# Patient Record
Sex: Female | Born: 1935 | Race: Black or African American | Hispanic: No | Smoking: Never smoker
Health system: Southern US, Community
[De-identification: ages and names within clinical notes are randomized; demographics above are authoritative.]

## PROBLEM LIST (undated history)

## (undated) DIAGNOSIS — M1990 Unspecified osteoarthritis, unspecified site: Secondary | ICD-10-CM

## (undated) DIAGNOSIS — E559 Vitamin D deficiency, unspecified: Secondary | ICD-10-CM

## (undated) DIAGNOSIS — K649 Unspecified hemorrhoids: Secondary | ICD-10-CM

## (undated) DIAGNOSIS — M48061 Spinal stenosis, lumbar region without neurogenic claudication: Secondary | ICD-10-CM

## (undated) DIAGNOSIS — Z9889 Other specified postprocedural states: Secondary | ICD-10-CM

## (undated) DIAGNOSIS — B019 Varicella without complication: Secondary | ICD-10-CM

## (undated) DIAGNOSIS — G629 Polyneuropathy, unspecified: Secondary | ICD-10-CM

## (undated) DIAGNOSIS — H269 Unspecified cataract: Secondary | ICD-10-CM

## (undated) DIAGNOSIS — M810 Age-related osteoporosis without current pathological fracture: Secondary | ICD-10-CM

## (undated) DIAGNOSIS — E119 Type 2 diabetes mellitus without complications: Secondary | ICD-10-CM

## (undated) DIAGNOSIS — N189 Chronic kidney disease, unspecified: Secondary | ICD-10-CM

## (undated) DIAGNOSIS — M109 Gout, unspecified: Secondary | ICD-10-CM

## (undated) DIAGNOSIS — I10 Essential (primary) hypertension: Secondary | ICD-10-CM

## (undated) DIAGNOSIS — K219 Gastro-esophageal reflux disease without esophagitis: Secondary | ICD-10-CM

## (undated) HISTORY — PX: KIDNEY STONE SURGERY: SHX686

## (undated) HISTORY — PX: EYE SURGERY: SHX253

## (undated) HISTORY — PX: RETINAL DETACHMENT SURGERY: SHX105

## (undated) HISTORY — PX: CHOLECYSTECTOMY: SHX55

---

## 1998-11-17 ENCOUNTER — Encounter: Payer: Self-pay | Admitting: Ophthalmology

## 1998-11-17 ENCOUNTER — Ambulatory Visit (HOSPITAL_COMMUNITY): Admission: RE | Admit: 1998-11-17 | Discharge: 1998-11-18 | Payer: Self-pay | Admitting: Ophthalmology

## 2005-01-31 ENCOUNTER — Ambulatory Visit: Payer: Self-pay | Admitting: Internal Medicine

## 2006-03-23 ENCOUNTER — Ambulatory Visit: Payer: Self-pay | Admitting: Internal Medicine

## 2006-09-15 ENCOUNTER — Emergency Department: Payer: Self-pay | Admitting: Emergency Medicine

## 2007-03-29 ENCOUNTER — Ambulatory Visit: Payer: Self-pay | Admitting: Internal Medicine

## 2008-03-31 ENCOUNTER — Ambulatory Visit: Payer: Self-pay | Admitting: Internal Medicine

## 2009-04-02 ENCOUNTER — Ambulatory Visit: Payer: Self-pay | Admitting: Internal Medicine

## 2009-07-14 ENCOUNTER — Ambulatory Visit: Payer: Self-pay | Admitting: Gastroenterology

## 2010-02-01 ENCOUNTER — Ambulatory Visit: Payer: Self-pay | Admitting: Unknown Physician Specialty

## 2011-04-05 ENCOUNTER — Ambulatory Visit: Payer: Self-pay | Admitting: Internal Medicine

## 2011-11-30 ENCOUNTER — Encounter (INDEPENDENT_AMBULATORY_CARE_PROVIDER_SITE_OTHER): Payer: Medicaid Other | Admitting: Ophthalmology

## 2011-11-30 DIAGNOSIS — E11359 Type 2 diabetes mellitus with proliferative diabetic retinopathy without macular edema: Secondary | ICD-10-CM

## 2011-11-30 DIAGNOSIS — H43819 Vitreous degeneration, unspecified eye: Secondary | ICD-10-CM

## 2011-11-30 DIAGNOSIS — E1165 Type 2 diabetes mellitus with hyperglycemia: Secondary | ICD-10-CM

## 2012-01-02 ENCOUNTER — Encounter (INDEPENDENT_AMBULATORY_CARE_PROVIDER_SITE_OTHER): Payer: Medicaid Other | Admitting: Ophthalmology

## 2012-01-02 DIAGNOSIS — H43819 Vitreous degeneration, unspecified eye: Secondary | ICD-10-CM

## 2012-01-02 DIAGNOSIS — E1165 Type 2 diabetes mellitus with hyperglycemia: Secondary | ICD-10-CM

## 2012-01-02 DIAGNOSIS — H26499 Other secondary cataract, unspecified eye: Secondary | ICD-10-CM

## 2012-01-02 DIAGNOSIS — E11359 Type 2 diabetes mellitus with proliferative diabetic retinopathy without macular edema: Secondary | ICD-10-CM

## 2012-01-18 ENCOUNTER — Ambulatory Visit (INDEPENDENT_AMBULATORY_CARE_PROVIDER_SITE_OTHER): Payer: PRIVATE HEALTH INSURANCE | Admitting: Ophthalmology

## 2012-01-20 ENCOUNTER — Ambulatory Visit (INDEPENDENT_AMBULATORY_CARE_PROVIDER_SITE_OTHER): Payer: PRIVATE HEALTH INSURANCE | Admitting: Ophthalmology

## 2012-01-20 DIAGNOSIS — H2700 Aphakia, unspecified eye: Secondary | ICD-10-CM

## 2012-01-20 DIAGNOSIS — E11359 Type 2 diabetes mellitus with proliferative diabetic retinopathy without macular edema: Secondary | ICD-10-CM

## 2012-01-20 DIAGNOSIS — E1139 Type 2 diabetes mellitus with other diabetic ophthalmic complication: Secondary | ICD-10-CM

## 2012-07-23 ENCOUNTER — Ambulatory Visit (INDEPENDENT_AMBULATORY_CARE_PROVIDER_SITE_OTHER): Payer: PRIVATE HEALTH INSURANCE | Admitting: Ophthalmology

## 2012-07-23 DIAGNOSIS — H43819 Vitreous degeneration, unspecified eye: Secondary | ICD-10-CM

## 2012-07-23 DIAGNOSIS — H35039 Hypertensive retinopathy, unspecified eye: Secondary | ICD-10-CM

## 2012-07-23 DIAGNOSIS — E1165 Type 2 diabetes mellitus with hyperglycemia: Secondary | ICD-10-CM

## 2012-07-23 DIAGNOSIS — I10 Essential (primary) hypertension: Secondary | ICD-10-CM

## 2012-07-23 DIAGNOSIS — E11359 Type 2 diabetes mellitus with proliferative diabetic retinopathy without macular edema: Secondary | ICD-10-CM

## 2012-12-26 ENCOUNTER — Emergency Department: Payer: Self-pay | Admitting: Emergency Medicine

## 2013-03-12 ENCOUNTER — Ambulatory Visit: Payer: Self-pay | Admitting: Internal Medicine

## 2013-04-22 ENCOUNTER — Ambulatory Visit (INDEPENDENT_AMBULATORY_CARE_PROVIDER_SITE_OTHER): Payer: PRIVATE HEALTH INSURANCE | Admitting: Ophthalmology

## 2013-04-22 DIAGNOSIS — I10 Essential (primary) hypertension: Secondary | ICD-10-CM

## 2013-04-22 DIAGNOSIS — E1139 Type 2 diabetes mellitus with other diabetic ophthalmic complication: Secondary | ICD-10-CM

## 2013-04-22 DIAGNOSIS — E11359 Type 2 diabetes mellitus with proliferative diabetic retinopathy without macular edema: Secondary | ICD-10-CM

## 2013-04-22 DIAGNOSIS — H43819 Vitreous degeneration, unspecified eye: Secondary | ICD-10-CM

## 2013-04-22 DIAGNOSIS — H47219 Primary optic atrophy, unspecified eye: Secondary | ICD-10-CM

## 2013-04-22 DIAGNOSIS — H35039 Hypertensive retinopathy, unspecified eye: Secondary | ICD-10-CM

## 2014-01-20 ENCOUNTER — Ambulatory Visit (INDEPENDENT_AMBULATORY_CARE_PROVIDER_SITE_OTHER): Payer: PRIVATE HEALTH INSURANCE | Admitting: Ophthalmology

## 2014-01-20 DIAGNOSIS — E1139 Type 2 diabetes mellitus with other diabetic ophthalmic complication: Secondary | ICD-10-CM

## 2014-01-20 DIAGNOSIS — I10 Essential (primary) hypertension: Secondary | ICD-10-CM

## 2014-01-20 DIAGNOSIS — H43819 Vitreous degeneration, unspecified eye: Secondary | ICD-10-CM

## 2014-01-20 DIAGNOSIS — H35039 Hypertensive retinopathy, unspecified eye: Secondary | ICD-10-CM

## 2014-01-20 DIAGNOSIS — H47219 Primary optic atrophy, unspecified eye: Secondary | ICD-10-CM

## 2014-01-20 DIAGNOSIS — E1165 Type 2 diabetes mellitus with hyperglycemia: Secondary | ICD-10-CM

## 2014-01-20 DIAGNOSIS — E11359 Type 2 diabetes mellitus with proliferative diabetic retinopathy without macular edema: Secondary | ICD-10-CM

## 2014-10-22 ENCOUNTER — Ambulatory Visit (INDEPENDENT_AMBULATORY_CARE_PROVIDER_SITE_OTHER): Payer: Medicare Other | Admitting: Ophthalmology

## 2014-10-22 DIAGNOSIS — E11311 Type 2 diabetes mellitus with unspecified diabetic retinopathy with macular edema: Secondary | ICD-10-CM

## 2014-10-22 DIAGNOSIS — H35033 Hypertensive retinopathy, bilateral: Secondary | ICD-10-CM | POA: Diagnosis not present

## 2014-10-22 DIAGNOSIS — H43813 Vitreous degeneration, bilateral: Secondary | ICD-10-CM

## 2014-10-22 DIAGNOSIS — I10 Essential (primary) hypertension: Secondary | ICD-10-CM

## 2014-10-22 DIAGNOSIS — E11351 Type 2 diabetes mellitus with proliferative diabetic retinopathy with macular edema: Secondary | ICD-10-CM | POA: Diagnosis not present

## 2014-10-22 DIAGNOSIS — E11359 Type 2 diabetes mellitus with proliferative diabetic retinopathy without macular edema: Secondary | ICD-10-CM

## 2014-11-06 ENCOUNTER — Ambulatory Visit
Admit: 2014-11-06 | Disposition: A | Discharge status: Home / Self Care | Payer: Self-pay | Attending: Gastroenterology | Admitting: Gastroenterology

## 2015-03-23 ENCOUNTER — Encounter: Payer: Self-pay | Admitting: *Deleted

## 2015-03-24 ENCOUNTER — Ambulatory Visit
Admission: RE | Admit: 2015-03-24 | Discharge: 2015-03-24 | Disposition: A | Discharge status: Home / Self Care | Payer: Medicare Other | Source: Ambulatory Visit | Attending: Gastroenterology | Admitting: Gastroenterology

## 2015-03-24 ENCOUNTER — Ambulatory Visit: Payer: Medicare Other | Admitting: Anesthesiology

## 2015-03-24 ENCOUNTER — Encounter
Admission: RE | Disposition: A | Discharge status: Home / Self Care | Payer: Self-pay | Source: Ambulatory Visit | Attending: Gastroenterology

## 2015-03-24 DIAGNOSIS — M810 Age-related osteoporosis without current pathological fracture: Secondary | ICD-10-CM | POA: Diagnosis not present

## 2015-03-24 DIAGNOSIS — K2950 Unspecified chronic gastritis without bleeding: Secondary | ICD-10-CM | POA: Diagnosis not present

## 2015-03-24 DIAGNOSIS — B3781 Candidal esophagitis: Secondary | ICD-10-CM | POA: Diagnosis not present

## 2015-03-24 DIAGNOSIS — R1310 Dysphagia, unspecified: Secondary | ICD-10-CM | POA: Diagnosis present

## 2015-03-24 DIAGNOSIS — E559 Vitamin D deficiency, unspecified: Secondary | ICD-10-CM | POA: Insufficient documentation

## 2015-03-24 DIAGNOSIS — Z7982 Long term (current) use of aspirin: Secondary | ICD-10-CM | POA: Insufficient documentation

## 2015-03-24 DIAGNOSIS — M109 Gout, unspecified: Secondary | ICD-10-CM | POA: Insufficient documentation

## 2015-03-24 DIAGNOSIS — Z79899 Other long term (current) drug therapy: Secondary | ICD-10-CM | POA: Insufficient documentation

## 2015-03-24 DIAGNOSIS — I10 Essential (primary) hypertension: Secondary | ICD-10-CM | POA: Insufficient documentation

## 2015-03-24 DIAGNOSIS — M1990 Unspecified osteoarthritis, unspecified site: Secondary | ICD-10-CM | POA: Diagnosis not present

## 2015-03-24 DIAGNOSIS — K219 Gastro-esophageal reflux disease without esophagitis: Secondary | ICD-10-CM | POA: Diagnosis not present

## 2015-03-24 DIAGNOSIS — E1140 Type 2 diabetes mellitus with diabetic neuropathy, unspecified: Secondary | ICD-10-CM | POA: Insufficient documentation

## 2015-03-24 DIAGNOSIS — K449 Diaphragmatic hernia without obstruction or gangrene: Secondary | ICD-10-CM | POA: Diagnosis not present

## 2015-03-24 HISTORY — PX: ESOPHAGOGASTRODUODENOSCOPY (EGD) WITH PROPOFOL: SHX5813

## 2015-03-24 HISTORY — DX: Spinal stenosis, lumbar region without neurogenic claudication: M48.061

## 2015-03-24 HISTORY — DX: Hypomagnesemia: E83.42

## 2015-03-24 HISTORY — DX: Unspecified osteoarthritis, unspecified site: M19.90

## 2015-03-24 HISTORY — DX: Unspecified cataract: H26.9

## 2015-03-24 HISTORY — DX: Other specified postprocedural states: Z98.890

## 2015-03-24 HISTORY — DX: Type 2 diabetes mellitus without complications: E11.9

## 2015-03-24 HISTORY — DX: Unspecified hemorrhoids: K64.9

## 2015-03-24 HISTORY — DX: Chronic kidney disease, unspecified: N18.9

## 2015-03-24 HISTORY — DX: Varicella without complication: B01.9

## 2015-03-24 HISTORY — DX: Essential (primary) hypertension: I10

## 2015-03-24 HISTORY — DX: Polyneuropathy, unspecified: G62.9

## 2015-03-24 HISTORY — DX: Gastro-esophageal reflux disease without esophagitis: K21.9

## 2015-03-24 HISTORY — DX: Age-related osteoporosis without current pathological fracture: M81.0

## 2015-03-24 HISTORY — DX: Vitamin D deficiency, unspecified: E55.9

## 2015-03-24 HISTORY — DX: Gout, unspecified: M10.9

## 2015-03-24 LAB — GLUCOSE, CAPILLARY: Glucose-Capillary: 118 mg/dL — ABNORMAL HIGH (ref 65–99)

## 2015-03-24 SURGERY — ESOPHAGOGASTRODUODENOSCOPY (EGD) WITH PROPOFOL
Anesthesia: General

## 2015-03-24 MED ORDER — SODIUM CHLORIDE 0.9 % IV SOLN
INTRAVENOUS | Status: DC
  Administered 2015-03-24: 1000 mL via INTRAVENOUS
  Administered 2015-03-24: 09:00:00 via INTRAVENOUS

## 2015-03-24 MED ORDER — FENTANYL CITRATE (PF) 100 MCG/2ML IJ SOLN
INTRAMUSCULAR | Status: DC | PRN
  Administered 2015-03-24: 50 ug via INTRAVENOUS

## 2015-03-24 MED ORDER — MIDAZOLAM HCL 5 MG/5ML IJ SOLN
INTRAMUSCULAR | Status: DC | PRN
  Administered 2015-03-24: 1 mg via INTRAVENOUS

## 2015-03-24 MED ORDER — PROPOFOL 10 MG/ML IV BOLUS
INTRAVENOUS | Status: DC | PRN
  Administered 2015-03-24: 50 mg via INTRAVENOUS
  Administered 2015-03-24: 25 mg via INTRAVENOUS
  Administered 2015-03-24: 50 mg via INTRAVENOUS

## 2015-03-24 NOTE — Transfer of Care (Signed)
Immediate Anesthesia Transfer of Care Note  Patient: Megan Griffin  Procedure(s) Performed: Procedure(s): ESOPHAGOGASTRODUODENOSCOPY (EGD) WITH PROPOFOL (N/A)  Patient Location: PACU  Anesthesia Type:General  Level of Consciousness: sedated  Airway & Oxygen Therapy: Patient Spontanous Breathing  Post-op Assessment: Report given to RN  Post vital signs: stable  Last Vitals:  Filed Vitals:   03/24/15 0825  BP: 166/86  Pulse: 90  Temp: 36.4 C  Resp: 18    Complications: No apparent anesthesia complications

## 2015-03-24 NOTE — Op Note (Signed)
[facility] Patient Name: Megan Griffin Procedure Date: 03/24/2015 8:53 AM MRN: [id number] Account #: [id number] Date of Birth: Jan 08, 1931 Admit Type: Outpatient Age: 81 Room: [facility] ENDO ROOM 3 Gender: Female Note Status: Finalized Procedure:         Upper GI endoscopy Indications:       Dysphagia Providers:         Christena Deem, MD Referring MD:      Neomia Dear. Harrington Challenger, MD (Referring MD) Medicines:         Monitored Anesthesia Care Complications:     No immediate complications. Procedure:         Pre-Anesthesia Assessment:                    - ASA Grade Assessment: III - A patient with severe                     systemic disease.                    After obtaining informed consent, the endoscope was passed                     under direct vision. Throughout the procedure, the                     patient's blood pressure, pulse, and oxygen saturations                     were monitored continuously. The Endoscope was introduced                     through the mouth, and advanced to the third part of                     duodenum. The patient tolerated the procedure well. The                     upper GI endoscopy was accomplished without difficulty. Findings:      The middle third of the esophagus and lower third of the esophagus were       significantly tortuous.      A medium-sized hiatus hernia was found. The Z-line was a variable       distance from incisors; the hiatal hernia was sliding.      Patchy candidiasis was found in the upper third of the esophagus, in the       middle third of the esophagus and in the lower third of the esophagus.      The Z-line was regular. Biopsies were taken with a cold forceps for       histology.      Patchy mild inflammation characterized by congestion (edema), erosions       and erythema was found in the gastric antrum. Biopsies were taken with a       cold forceps for histology. Biopsies were taken with  a cold forceps for       Helicobacter pylori testing.      The cardia and gastric fundus were normal on retroflexion pootherwise,       note moderate sliding hiatal hernia.      The examined duodenum was normal. Impression:        - Tortuous esophagus.                    -  Medium-sized hiatus hernia.                    - Monilial esophagitis.                    - Z-line regular. Biopsied.                    - Erosive gastritis. Biopsied.                    - Normal examined duodenum. Recommendation:    - Discharge patient to home.                    - Continue present medications. Procedure Code(s): --- Professional ---                    [phone], Esophagogastroduodenoscopy, flexible, transoral;                     with biopsy, single or multiple Diagnosis Code(s): --- Professional ---                    750.4, Other specified anomalies of esophagus                    112.84, Candidal esophagitis                    535.40, Other specified gastritis, without mention of                     hemorrhage                    787.20, Dysphagia, unspecified                    553.3, Diaphragmatic hernia without mention of obstruction                     or gangrene CPT copyright 2014 American Medical Association. All rights reserved. The codes documented in this report are preliminary and upon coder review may  be revised to meet current compliance requirements. Christena Deem, MD 03/24/2015 9:16:43 AM This report has been signed electronically. Number of Addenda: 0 Note Initiated On: 03/24/2015 8:53 AM      [facility]

## 2015-03-24 NOTE — Anesthesia Preprocedure Evaluation (Addendum)
Anesthesia Evaluation  Patient identified by MRN, date of birth, ID band Patient awake    Reviewed: Allergy & Precautions, NPO status , Patient's Chart, lab work & pertinent test results  History of Anesthesia Complications Negative for: history of anesthetic complications  Airway Mallampati: II  TM Distance: >3 FB Neck ROM: Full    Dental  (+) Upper Dentures, Chipped, Missing   Pulmonary neg pulmonary ROS,           Cardiovascular hypertension, Pt. on medications      Neuro/Psych  Neuromuscular disease (pheripheralneuropathy)    GI/Hepatic Neg liver ROS, GERD  Medicated,  Endo/Other  diabetes, Type 2, Oral Hypoglycemic Agents  Renal/GU Renal InsufficiencyRenal disease     Musculoskeletal  (+) Arthritis  (Gout),   Abdominal   Peds  Hematology negative hematology ROS (+)   Anesthesia Other Findings   Reproductive/Obstetrics                            Anesthesia Physical Anesthesia Plan  ASA: III  Anesthesia Plan: General   Post-op Pain Management:    Induction: Intravenous  Airway Management Planned: Nasal Cannula  Additional Equipment:   Intra-op Plan:   Post-operative Plan:   Informed Consent: I have reviewed the patients History and Physical, chart, labs and discussed the procedure including the risks, benefits and alternatives for the proposed anesthesia with the patient or authorized representative who has indicated his/her understanding and acceptance.     Plan Discussed with:   Anesthesia Plan Comments:         Anesthesia Quick Evaluation

## 2015-03-24 NOTE — H&P (Addendum)
Outpatient short stay form Pre-procedure 03/24/2015 8:50 AM Christena Deem MD  Primary Physician: Dr. Hal Morales  Reason for visit:  Dysphagia, abnormal barium swallow  History of present illness:  Patient is a 81 year old female presenting today with complaint of his age it. She was initially seen in April 2016. There is a barium swallow done showing marked distal esophageal tortuosity a medium-sized hiatal hernia also reflux. Report stated there was some narrowing of the distal esophagus however she passed the barium tablet without difficulty. Since then she has been in careful with eating and this has nearly resolved her issues. It is of note that she has no upper teeth and molars on the right side lower.    Current facility-administered medications:  .  0.9 %  sodium chloride infusion, , Intravenous, Continuous, Christena Deem, MD, Last Rate: 20 mL/hr at 03/24/15 0841, 1,000 mL at 03/24/15 0841  Prescriptions prior to admission  Medication Sig Dispense Refill Last Dose  . acetaminophen (TYLENOL) 500 MG tablet Take 500 mg by mouth every 6 (six) hours as needed.   03/24/2015 at Unknown time  . allopurinol (ZYLOPRIM) 100 MG tablet Take 200 mg by mouth daily.   03/24/2015 at Unknown time  . amLODipine (NORVASC) 5 MG tablet Take 5 mg by mouth daily.   03/24/2015 at Unknown time  . aspirin 81 MG tablet Take 81 mg by mouth daily.   03/23/2015 at Unknown time  . calcium-vitamin D (OSCAL WITH D) 500-200 MG-UNIT per tablet Take 1 tablet by mouth.   Past Week at Unknown time  . Cyanocobalamin (VITAMIN B 12) 250 MCG LOZG Take by mouth.     . Fish Oil-Cholecalciferol (FISH OIL + D3) 1000-1000 MG-UNIT CAPS Take 1,000 mg by mouth every morning.   Past Week at Unknown time  . furosemide (LASIX) 40 MG tablet Take 40 mg by mouth daily.   Past Week at Unknown time  . glipiZIDE (GLUCOTROL XL) 2.5 MG 24 hr tablet Take 2.5 mg by mouth daily with breakfast.     . glucose blood test strip 1 each by Other  route as needed for other. Use as instructed   03/23/2015 at Unknown time  . lisinopril (PRINIVIL,ZESTRIL) 40 MG tablet Take 40 mg by mouth daily.   03/23/2015 at Unknown time  . magnesium oxide (MAG-OX) 400 MG tablet Take 400 mg by mouth daily.   03/24/2015 at Unknown time  . Multiple Vitamin (MULTIVITAMIN) tablet Take 1 tablet by mouth daily.   03/24/2015 at Unknown time  . omeprazole (PRILOSEC) 20 MG capsule Take 20 mg by mouth daily.     . vitamin C (ASCORBIC ACID) 500 MG tablet Take 500 mg by mouth daily.   Past Week at Unknown time     Allergies  Allergen Reactions  . Actonel [Risedronate Sodium]   . Bextra [Valdecoxib]   . Lipitor [Atorvastatin]   . Lovastatin   . Sular [Nisoldipine Er]   . Zocor [Simvastatin]      Past Medical History  Diagnosis Date  . Hypertension   . Diabetes mellitus without complication   . Gout   . Arthritis   . Osteoporosis   . Lumbar spinal stenosis   . GERD (gastroesophageal reflux disease)   . Vitamin D deficiency   . Hypomagnesemia   . Chronic kidney disease   . Peripheral neuropathy   . Chickenpox   . Hemorrhoids   . Cataracts, bilateral   . H/O colonoscopy     Review of  systems:      Physical Exam    Heart and lungs: Regular rate and rhythm without rub or gallop, lungs are bilaterally clear    HEENT: Norm cephalic atraumatic eyes are anicteric    Other:     Pertinant exam for procedure: Soft nontender nondistended bowel sounds positive normoactive    Planned proceedures: EGD and indicated procedures. I have discussed the risks benefits and complications of procedures to include not limited to bleeding, infection, perforation and the risk of sedation and the patient wishes to proceed.    Christena Deem, MD Gastroenterology 03/24/2015  8:50 AM

## 2015-03-24 NOTE — Anesthesia Postprocedure Evaluation (Signed)
  Anesthesia Post-op Note  Patient: Megan Griffin  Procedure(s) Performed: Procedure(s): ESOPHAGOGASTRODUODENOSCOPY (EGD) WITH PROPOFOL (N/A)  Anesthesia type:General  Patient location: PACU  Post pain: Pain level controlled  Post assessment: Post-op Vital signs reviewed, Patient's Cardiovascular Status Stable, Respiratory Function Stable, Patent Airway and No signs of Nausea or vomiting  Post vital signs: Reviewed and stable  Last Vitals:  Filed Vitals:   03/24/15 0913  BP: 109/70  Pulse: 80  Temp: 36.1 C  Resp: 16    Level of consciousness: awake, alert  and patient cooperative  Complications: No apparent anesthesia complications

## 2015-03-25 ENCOUNTER — Encounter: Payer: Self-pay | Admitting: Gastroenterology

## 2015-03-25 LAB — SURGICAL PATHOLOGY

## 2015-07-24 ENCOUNTER — Ambulatory Visit (INDEPENDENT_AMBULATORY_CARE_PROVIDER_SITE_OTHER): Payer: Medicare Other | Admitting: Ophthalmology

## 2015-08-17 ENCOUNTER — Ambulatory Visit (INDEPENDENT_AMBULATORY_CARE_PROVIDER_SITE_OTHER): Payer: Medicare Other | Admitting: Ophthalmology

## 2015-08-17 DIAGNOSIS — I10 Essential (primary) hypertension: Secondary | ICD-10-CM | POA: Diagnosis not present

## 2015-08-17 DIAGNOSIS — E11319 Type 2 diabetes mellitus with unspecified diabetic retinopathy without macular edema: Secondary | ICD-10-CM

## 2015-08-17 DIAGNOSIS — H43813 Vitreous degeneration, bilateral: Secondary | ICD-10-CM

## 2015-08-17 DIAGNOSIS — H35033 Hypertensive retinopathy, bilateral: Secondary | ICD-10-CM | POA: Diagnosis not present

## 2015-08-17 DIAGNOSIS — E113593 Type 2 diabetes mellitus with proliferative diabetic retinopathy without macular edema, bilateral: Secondary | ICD-10-CM

## 2015-09-22 ENCOUNTER — Other Ambulatory Visit: Payer: Self-pay | Admitting: Internal Medicine

## 2015-09-22 DIAGNOSIS — Z1231 Encounter for screening mammogram for malignant neoplasm of breast: Secondary | ICD-10-CM

## 2015-10-15 ENCOUNTER — Ambulatory Visit
Admission: RE | Admit: 2015-10-15 | Discharge: 2015-10-15 | Disposition: A | Discharge status: Home / Self Care | Payer: Medicare Other | Source: Ambulatory Visit | Attending: Internal Medicine | Admitting: Internal Medicine

## 2015-10-15 DIAGNOSIS — Z1231 Encounter for screening mammogram for malignant neoplasm of breast: Secondary | ICD-10-CM | POA: Diagnosis not present

## 2016-05-16 ENCOUNTER — Ambulatory Visit (INDEPENDENT_AMBULATORY_CARE_PROVIDER_SITE_OTHER): Payer: Medicare Other | Admitting: Ophthalmology

## 2016-05-16 DIAGNOSIS — E113593 Type 2 diabetes mellitus with proliferative diabetic retinopathy without macular edema, bilateral: Secondary | ICD-10-CM

## 2016-05-16 DIAGNOSIS — E11319 Type 2 diabetes mellitus with unspecified diabetic retinopathy without macular edema: Secondary | ICD-10-CM

## 2016-05-16 DIAGNOSIS — I10 Essential (primary) hypertension: Secondary | ICD-10-CM | POA: Diagnosis not present

## 2016-05-16 DIAGNOSIS — H43813 Vitreous degeneration, bilateral: Secondary | ICD-10-CM | POA: Diagnosis not present

## 2016-05-16 DIAGNOSIS — H35033 Hypertensive retinopathy, bilateral: Secondary | ICD-10-CM

## 2016-11-21 ENCOUNTER — Other Ambulatory Visit: Payer: Self-pay | Admitting: Internal Medicine

## 2016-11-21 DIAGNOSIS — Z1231 Encounter for screening mammogram for malignant neoplasm of breast: Secondary | ICD-10-CM

## 2016-12-01 ENCOUNTER — Ambulatory Visit
Admission: RE | Admit: 2016-12-01 | Discharge: 2016-12-01 | Disposition: A | Discharge status: Home / Self Care | Payer: Medicare Other | Source: Ambulatory Visit | Attending: Internal Medicine | Admitting: Internal Medicine

## 2016-12-01 DIAGNOSIS — Z1231 Encounter for screening mammogram for malignant neoplasm of breast: Secondary | ICD-10-CM | POA: Insufficient documentation

## 2017-02-13 ENCOUNTER — Ambulatory Visit (INDEPENDENT_AMBULATORY_CARE_PROVIDER_SITE_OTHER): Payer: Medicare Other | Admitting: Ophthalmology

## 2017-02-13 DIAGNOSIS — H35033 Hypertensive retinopathy, bilateral: Secondary | ICD-10-CM

## 2017-02-13 DIAGNOSIS — I10 Essential (primary) hypertension: Secondary | ICD-10-CM | POA: Diagnosis not present

## 2017-02-13 DIAGNOSIS — E113593 Type 2 diabetes mellitus with proliferative diabetic retinopathy without macular edema, bilateral: Secondary | ICD-10-CM

## 2017-02-13 DIAGNOSIS — E11319 Type 2 diabetes mellitus with unspecified diabetic retinopathy without macular edema: Secondary | ICD-10-CM

## 2017-11-14 ENCOUNTER — Encounter (INDEPENDENT_AMBULATORY_CARE_PROVIDER_SITE_OTHER): Payer: Medicare Other | Admitting: Ophthalmology

## 2017-11-23 ENCOUNTER — Encounter (INDEPENDENT_AMBULATORY_CARE_PROVIDER_SITE_OTHER): Payer: Medicare Other | Admitting: Ophthalmology

## 2017-11-23 DIAGNOSIS — E11319 Type 2 diabetes mellitus with unspecified diabetic retinopathy without macular edema: Secondary | ICD-10-CM

## 2017-11-23 DIAGNOSIS — E113593 Type 2 diabetes mellitus with proliferative diabetic retinopathy without macular edema, bilateral: Secondary | ICD-10-CM

## 2017-11-23 DIAGNOSIS — I10 Essential (primary) hypertension: Secondary | ICD-10-CM

## 2017-11-23 DIAGNOSIS — H43813 Vitreous degeneration, bilateral: Secondary | ICD-10-CM | POA: Diagnosis not present

## 2017-11-23 DIAGNOSIS — H35033 Hypertensive retinopathy, bilateral: Secondary | ICD-10-CM

## 2018-09-10 ENCOUNTER — Encounter (INDEPENDENT_AMBULATORY_CARE_PROVIDER_SITE_OTHER): Payer: Medicare Other | Admitting: Ophthalmology

## 2018-09-10 DIAGNOSIS — I10 Essential (primary) hypertension: Secondary | ICD-10-CM | POA: Diagnosis not present

## 2018-09-10 DIAGNOSIS — E113593 Type 2 diabetes mellitus with proliferative diabetic retinopathy without macular edema, bilateral: Secondary | ICD-10-CM | POA: Diagnosis not present

## 2018-09-10 DIAGNOSIS — E11319 Type 2 diabetes mellitus with unspecified diabetic retinopathy without macular edema: Secondary | ICD-10-CM

## 2018-09-10 DIAGNOSIS — H35033 Hypertensive retinopathy, bilateral: Secondary | ICD-10-CM | POA: Diagnosis not present

## 2018-09-10 DIAGNOSIS — H43813 Vitreous degeneration, bilateral: Secondary | ICD-10-CM

## 2019-04-01 ENCOUNTER — Other Ambulatory Visit: Payer: Self-pay | Admitting: Internal Medicine

## 2019-04-01 DIAGNOSIS — Z1231 Encounter for screening mammogram for malignant neoplasm of breast: Secondary | ICD-10-CM

## 2019-04-16 ENCOUNTER — Ambulatory Visit
Admission: RE | Admit: 2019-04-16 | Discharge: 2019-04-16 | Disposition: A | Discharge status: Home / Self Care | Payer: Medicare Other | Source: Ambulatory Visit | Attending: Internal Medicine | Admitting: Internal Medicine

## 2019-04-16 ENCOUNTER — Other Ambulatory Visit: Payer: Self-pay

## 2019-04-16 DIAGNOSIS — Z1231 Encounter for screening mammogram for malignant neoplasm of breast: Secondary | ICD-10-CM | POA: Insufficient documentation

## 2019-06-12 ENCOUNTER — Encounter (INDEPENDENT_AMBULATORY_CARE_PROVIDER_SITE_OTHER): Payer: Medicare Other | Admitting: Ophthalmology

## 2019-07-03 ENCOUNTER — Encounter (INDEPENDENT_AMBULATORY_CARE_PROVIDER_SITE_OTHER): Payer: Medicare Other | Admitting: Ophthalmology

## 2019-07-03 DIAGNOSIS — H43813 Vitreous degeneration, bilateral: Secondary | ICD-10-CM

## 2019-07-03 DIAGNOSIS — E113593 Type 2 diabetes mellitus with proliferative diabetic retinopathy without macular edema, bilateral: Secondary | ICD-10-CM | POA: Diagnosis not present

## 2019-07-03 DIAGNOSIS — H35033 Hypertensive retinopathy, bilateral: Secondary | ICD-10-CM | POA: Diagnosis not present

## 2019-07-03 DIAGNOSIS — I10 Essential (primary) hypertension: Secondary | ICD-10-CM | POA: Diagnosis not present

## 2019-07-03 DIAGNOSIS — E11319 Type 2 diabetes mellitus with unspecified diabetic retinopathy without macular edema: Secondary | ICD-10-CM | POA: Diagnosis not present

## 2019-09-13 ENCOUNTER — Ambulatory Visit: Payer: Medicare Other | Attending: Internal Medicine

## 2019-09-13 DIAGNOSIS — Z23 Encounter for immunization: Secondary | ICD-10-CM | POA: Insufficient documentation

## 2019-09-13 NOTE — Progress Notes (Signed)
   Covid-19 Vaccination Clinic  Name:  JUANICE WARBURTON    MRN: [id number] DOB: 01-12-1931  09/13/2019  Ms. Draughon was observed post Covid-19 immunization for 15 minutes without incident. She was provided with Vaccine Information Sheet and instruction to access the V-Safe system.   Ms. Stanek was instructed to call 911 with any severe reactions post vaccine: Marland Kitchen Difficulty breathing  . Swelling of face and throat  . A fast heartbeat  . A bad rash all over body  . Dizziness and weakness   Immunizations Administered    Name Date Dose VIS Date Route   Pfizer COVID-19 Vaccine 09/13/2019 10:56 AM 0.3 mL 06/21/2019 Intramuscular   Manufacturer: [employer], [employer]   Lot: [id number]   NDC: [address]-1

## 2019-10-04 ENCOUNTER — Ambulatory Visit: Payer: Medicare Other | Attending: Internal Medicine

## 2019-10-04 DIAGNOSIS — Z23 Encounter for immunization: Secondary | ICD-10-CM

## 2019-10-04 NOTE — Progress Notes (Signed)
   Covid-19 Vaccination Clinic  Name:  Megan Griffin    MRN: [id number] DOB: November 03, 1930  10/04/2019  Megan Griffin was observed post Covid-19 immunization for 15 minutes without incident. She was provided with Vaccine Information Sheet and instruction to access the V-Safe system.   Megan Griffin was instructed to call 911 with any severe reactions post vaccine: Marland Kitchen Difficulty breathing  . Swelling of face and throat  . A fast heartbeat  . A bad rash all over body  . Dizziness and weakness   Immunizations Administered    Name Date Dose VIS Date Route   Pfizer COVID-19 Vaccine 10/04/2019 11:09 AM 0.3 mL 06/21/2019 Intramuscular   Manufacturer: [employer], [employer]   Lot: [id number]   NDC: [address]-2

## 2020-03-31 ENCOUNTER — Encounter (INDEPENDENT_AMBULATORY_CARE_PROVIDER_SITE_OTHER): Payer: Medicare Other | Admitting: Ophthalmology

## 2020-04-02 ENCOUNTER — Encounter (INDEPENDENT_AMBULATORY_CARE_PROVIDER_SITE_OTHER): Payer: Medicare Other | Admitting: Ophthalmology

## 2020-04-10 ENCOUNTER — Other Ambulatory Visit: Payer: Self-pay

## 2020-04-10 ENCOUNTER — Encounter (INDEPENDENT_AMBULATORY_CARE_PROVIDER_SITE_OTHER): Payer: Medicare Other | Admitting: Ophthalmology

## 2020-04-10 DIAGNOSIS — E113592 Type 2 diabetes mellitus with proliferative diabetic retinopathy without macular edema, left eye: Secondary | ICD-10-CM

## 2020-04-10 DIAGNOSIS — H43813 Vitreous degeneration, bilateral: Secondary | ICD-10-CM

## 2020-04-10 DIAGNOSIS — E113511 Type 2 diabetes mellitus with proliferative diabetic retinopathy with macular edema, right eye: Secondary | ICD-10-CM | POA: Diagnosis not present

## 2020-04-10 DIAGNOSIS — H35371 Puckering of macula, right eye: Secondary | ICD-10-CM

## 2020-04-10 DIAGNOSIS — E11311 Type 2 diabetes mellitus with unspecified diabetic retinopathy with macular edema: Secondary | ICD-10-CM | POA: Diagnosis not present

## 2020-04-10 DIAGNOSIS — I10 Essential (primary) hypertension: Secondary | ICD-10-CM

## 2020-04-10 DIAGNOSIS — H35033 Hypertensive retinopathy, bilateral: Secondary | ICD-10-CM

## 2020-07-20 ENCOUNTER — Encounter: Payer: Self-pay | Admitting: Emergency Medicine

## 2020-07-20 ENCOUNTER — Emergency Department: Payer: Medicare Other

## 2020-07-20 ENCOUNTER — Other Ambulatory Visit: Payer: Self-pay

## 2020-07-20 ENCOUNTER — Emergency Department
Admission: EM | Admit: 2020-07-20 | Discharge: 2020-07-20 | Disposition: A | Discharge status: Home / Self Care | Payer: Medicare Other | Attending: Emergency Medicine | Admitting: Emergency Medicine

## 2020-07-20 DIAGNOSIS — Z7982 Long term (current) use of aspirin: Secondary | ICD-10-CM | POA: Insufficient documentation

## 2020-07-20 DIAGNOSIS — I700 Atherosclerosis of aorta: Secondary | ICD-10-CM | POA: Diagnosis not present

## 2020-07-20 DIAGNOSIS — E1122 Type 2 diabetes mellitus with diabetic chronic kidney disease: Secondary | ICD-10-CM | POA: Insufficient documentation

## 2020-07-20 DIAGNOSIS — Z7984 Long term (current) use of oral hypoglycemic drugs: Secondary | ICD-10-CM | POA: Insufficient documentation

## 2020-07-20 DIAGNOSIS — R220 Localized swelling, mass and lump, head: Secondary | ICD-10-CM

## 2020-07-20 DIAGNOSIS — I129 Hypertensive chronic kidney disease with stage 1 through stage 4 chronic kidney disease, or unspecified chronic kidney disease: Secondary | ICD-10-CM | POA: Insufficient documentation

## 2020-07-20 DIAGNOSIS — Z79899 Other long term (current) drug therapy: Secondary | ICD-10-CM | POA: Diagnosis not present

## 2020-07-20 DIAGNOSIS — M6281 Muscle weakness (generalized): Secondary | ICD-10-CM | POA: Diagnosis not present

## 2020-07-20 DIAGNOSIS — K449 Diaphragmatic hernia without obstruction or gangrene: Secondary | ICD-10-CM | POA: Insufficient documentation

## 2020-07-20 DIAGNOSIS — R29898 Other symptoms and signs involving the musculoskeletal system: Secondary | ICD-10-CM

## 2020-07-20 DIAGNOSIS — N189 Chronic kidney disease, unspecified: Secondary | ICD-10-CM | POA: Insufficient documentation

## 2020-07-20 DIAGNOSIS — E1142 Type 2 diabetes mellitus with diabetic polyneuropathy: Secondary | ICD-10-CM | POA: Insufficient documentation

## 2020-07-20 DIAGNOSIS — I6782 Cerebral ischemia: Secondary | ICD-10-CM | POA: Diagnosis not present

## 2020-07-20 DIAGNOSIS — M25511 Pain in right shoulder: Secondary | ICD-10-CM | POA: Diagnosis present

## 2020-07-20 LAB — BASIC METABOLIC PANEL
Anion gap: 10 (ref 5–15)
BUN: 21 mg/dL (ref 8–23)
CO2: 26 mmol/L (ref 22–32)
Calcium: 9.4 mg/dL (ref 8.9–10.3)
Chloride: 103 mmol/L (ref 98–111)
Creatinine, Ser: 1.17 mg/dL — ABNORMAL HIGH (ref 0.44–1.00)
GFR, Estimated: 45 mL/min — ABNORMAL LOW (ref >60)
Glucose, Bld: 162 mg/dL — ABNORMAL HIGH (ref 70–99)
Potassium: 3.8 mmol/L (ref 3.5–5.1)
Sodium: 139 mmol/L (ref 135–145)

## 2020-07-20 LAB — CBC
HCT: 32.8 % — ABNORMAL LOW (ref 36.0–46.0)
Hemoglobin: 10.8 g/dL — ABNORMAL LOW (ref 12.0–15.0)
MCH: 32.3 pg (ref 26.0–34.0)
MCHC: 32.9 g/dL (ref 30.0–36.0)
MCV: 98.2 fL (ref 80.0–100.0)
Platelets: 304 10*3/uL (ref 150–400)
RBC: 3.34 MIL/uL — ABNORMAL LOW (ref 3.87–5.11)
RDW: 15.6 % — ABNORMAL HIGH (ref 11.5–15.5)
WBC: 7.2 10*3/uL (ref 4.0–10.5)
nRBC: 0 % (ref 0.0–0.2)

## 2020-07-20 LAB — CBG MONITORING, ED: Glucose-Capillary: 135 mg/dL — ABNORMAL HIGH (ref 70–99)

## 2020-07-20 MED ORDER — IOHEXOL 300 MG/ML  SOLN
100.0000 mL | Freq: Once | INTRAMUSCULAR | Status: AC | PRN
  Administered 2020-07-20: 100 mL via INTRAVENOUS

## 2020-07-20 MED ORDER — GADOBUTROL 1 MMOL/ML IV SOLN
6.0000 mL | Freq: Once | INTRAVENOUS | Status: AC | PRN
  Administered 2020-07-20: 6 mL via INTRAVENOUS

## 2020-07-20 NOTE — ED Triage Notes (Signed)
PT to ER states she "lost use" of her right hand "sometime after the first of the year".  States family told her on Christmas that her mouth looked twisted.  Pt states contacted her PCP for an appointment and was told she should come to ER. PT ambulatory with walker without difficulty.

## 2020-07-20 NOTE — ED Provider Notes (Signed)
Not  [facility] Emergency Department Provider Note  ____________________________________________   Event Date/Time   First MD Initiated Contact with Patient 07/20/20 1513     (approximate)  I have reviewed the triage vital signs and the nursing notes.   HISTORY  Chief Complaint Arm Pain    HPI Megan Griffin is a 87 y.o. female with CKD, diabetes, hypertension who comes in for arm pain.  Patient states that she lost the use of her right hand sometime after the first the year she also was concerned that her mouth looked different.  Patient states that she was having some pain in her right shoulder so she was not sure if it was just from that versus a stroke.  She states that she cannot lift the right arm up over her shoulder.  It feels weaker, constant, nothing makes better, nothing makes it worse.  Family also noted that the right side of her face seemed a little bit more droopy.  She denies any difficulties with walking or falls.  She is not on any blood thinners          Past Medical History:  Diagnosis Date  . Arthritis   . Cataracts, bilateral   . Chickenpox   . Chronic kidney disease   . Diabetes mellitus without complication ([place])   . GERD (gastroesophageal reflux disease)   . Gout   . H/O colonoscopy   . Hemorrhoids   . Hypertension   . Hypomagnesemia   . Lumbar spinal stenosis   . Osteoporosis   . Peripheral neuropathy   . Vitamin D deficiency     There are no problems to display for this patient.   Past Surgical History:  Procedure Laterality Date  . CHOLECYSTECTOMY    . ESOPHAGOGASTRODUODENOSCOPY (EGD) WITH PROPOFOL N/A 03/24/2015   Procedure: ESOPHAGOGASTRODUODENOSCOPY (EGD) WITH PROPOFOL;  Surgeon: Lollie Sails, MD;  Location: [facility] ENDOSCOPY;  Service: Endoscopy;  Laterality: N/A;  . EYE SURGERY    . KIDNEY STONE SURGERY    . RETINAL DETACHMENT SURGERY      Prior to Admission medications   Medication Sig Start Date  End Date Taking? Authorizing Provider  acetaminophen (TYLENOL) 500 MG tablet Take 500 mg by mouth every 6 (six) hours as needed.    [provider]  allopurinol (ZYLOPRIM) 100 MG tablet Take 200 mg by mouth daily.    [provider]  amLODipine (NORVASC) 5 MG tablet Take 5 mg by mouth daily.    [provider]  aspirin 81 MG tablet Take 81 mg by mouth daily.    [provider]  calcium-vitamin D (OSCAL WITH D) 500-200 MG-UNIT per tablet Take 1 tablet by mouth.    [provider]  Cyanocobalamin (VITAMIN B 12) 250 MCG LOZG Take by mouth.    [provider]  Fish Oil-Cholecalciferol (FISH OIL + D3) 1000-1000 MG-UNIT CAPS Take 1,000 mg by mouth every morning.    [provider]  furosemide (LASIX) 40 MG tablet Take 40 mg by mouth daily.    [provider]  glipiZIDE (GLUCOTROL XL) 2.5 MG 24 hr tablet Take 2.5 mg by mouth daily with breakfast.    [provider]  glucose blood test strip 1 each by Other route as needed for other. Use as instructed    [provider]  lisinopril (PRINIVIL,ZESTRIL) 40 MG tablet Take 40 mg by mouth daily.    [provider]  magnesium oxide (MAG-OX) 400 MG tablet  Take 400 mg by mouth daily.    [provider]  Multiple Vitamin (MULTIVITAMIN) tablet Take 1 tablet by mouth daily.    [provider]  omeprazole (PRILOSEC) 20 MG capsule Take 20 mg by mouth daily.    [provider]  vitamin C (ASCORBIC ACID) 500 MG tablet Take 500 mg by mouth daily.    [provider]    Allergies Actonel [risedronate sodium], Bextra [valdecoxib], Lipitor [atorvastatin], Lovastatin, Sular [nisoldipine er], and Zocor [simvastatin]  Family History  Problem Relation Age of Onset  . Breast cancer Paternal Aunt     Social History Social History   Tobacco Use  . Smoking status: Never Smoker  . Smokeless tobacco: Never Used  Substance Use Topics  .  Alcohol use: No  . Drug use: No      Review of Systems Constitutional: No fever/chills Eyes: No visual changes. ENT: No sore throat. Cardiovascular: Denies chest pain. Respiratory: Denies shortness of breath. Gastrointestinal: No abdominal pain.  No nausea, no vomiting.  No diarrhea.  No constipation. Genitourinary: Negative for dysuria. Musculoskeletal: Negative for back pain. Skin: Negative for rash. Neurological: Changes in face, right arm weakness All other ROS negative ____________________________________________   PHYSICAL EXAM:  VITAL SIGNS: ED Triage Vitals  Enc Vitals Group     BP 07/20/20 1128 (!) 166/81     Pulse Rate 07/20/20 1128 74     Resp 07/20/20 1128 18     Temp 07/20/20 1128 (!) 97.5 F (36.4 C)     Temp Source 07/20/20 1128 Oral     SpO2 07/20/20 1128 97 %     Weight 07/20/20 1132 143 lb (64.9 kg)     Height 07/20/20 1132 4\' 11"  (1.499 m)     Head Circumference --      Peak Flow --      Pain Score 07/20/20 1132 0     Pain Loc --      Pain Edu? --      Excl. in [place]? --     Constitutional: Alert and oriented. Well appearing and in no acute distress. Eyes: Conjunctivae are normal. EOMI. Head: Atraumatic. Nose: No congestion/rhinnorhea. Mouth/Throat: Mucous membranes are moist.   Neck: No stridor. Trachea Midline. FROM Cardiovascular: Normal rate, regular rhythm. Grossly normal heart sounds.  Good peripheral circulation. Respiratory: Normal respiratory effort.  No retractions. Lungs CTAB. Gastrointestinal: Soft and nontender. No distention. No abdominal bruits.  Musculoskeletal: No lower extremity tenderness nor edema.  No joint effusions. Neurologic:  Normal speech and language.  Decreased strength in the right arm sensation intact.  Good distal pulse.  No obvious deformity.  She has facial droop slightly on the right side. Skin:  Skin is warm, dry and intact. No rash noted. Psychiatric: Mood and affect are normal. Speech and behavior are  normal. GU: Deferred   ____________________________________________   LABS (all labs ordered are listed, but only abnormal results are displayed)  Labs Reviewed  BASIC METABOLIC PANEL - Abnormal; Notable for the following components:      Result Value   Glucose, Bld 162 (*)    Creatinine, Ser 1.17 (*)    GFR, Estimated 45 (*)    All other components within normal limits  CBC - Abnormal; Notable for the following components:   RBC 3.34 (*)    Hemoglobin 10.8 (*)    HCT 32.8 (*)    RDW 15.6 (*)    All other components within normal limits   ____________________________________________  ED ECG REPORT I, Vanessa Elgin, the attending physician, personally viewed and interpreted this ECG.  Sinus rate of 68, no ST elevation, no T wave inversions, normal intervals, type I AV block ____________________________________________  RADIOLOGY   Official radiology report(s): CT Head Wo Contrast  Result Date: 07/20/2020 CLINICAL DATA:  Right upper extremity weakness EXAM: CT HEAD WITHOUT CONTRAST TECHNIQUE: Contiguous axial images were obtained from the base of the skull through the vertex without intravenous contrast. COMPARISON:  None. FINDINGS: Brain: There is no evidence for acute hemorrhage, hydrocephalus, mass lesion, or abnormal extra-axial fluid collection. Asymmetric hypodensity is seen in the white matter of the high left frontal parietal region (image 19/series 2) with relatively good preservation of overlying cortex. While this may be post ischemic, appearance raises the question of vasogenic edema. No hydrocephalus or substantial midline shift. Small lacunar infarct noted right basal ganglia. Vascular: No hyperdense vessel or unexpected calcification. Skull: No evidence for fracture. No worrisome lytic or sclerotic lesion. Sinuses/Orbits: The visualized paranasal sinuses and mastoid air cells are clear. Visualized portions of the globes and intraorbital fat are unremarkable. Other:  None. IMPRESSION: Asymmetric hypodensity in the subcortical white matter of the high left frontal parietal region with apparent sparing of overlying cortex. While this may be post ischemic, appearance raises the question of vasogenic edema. MRI of the brain without and with contrast recommended to further evaluate. I personally called these results to Dr. Kerman Passey at 1307 hours on 07/20/2020. Electronically Signed   By: Misty Stanley M.D.   On: 07/20/2020 13:08    ____________________________________________   PROCEDURES  Procedure(s) performed (including Critical Care):  Procedures   ____________________________________________   INITIAL IMPRESSION / ASSESSMENT AND PLAN / ED COURSE  Megan Griffin was evaluated in Emergency Department on 07/20/2020 for the symptoms described in the history of present illness. She was evaluated in the context of the global COVID-19 pandemic, which necessitated consideration that the patient might be at risk for infection with the SARS-CoV-2 virus that causes COVID-19. Institutional protocols and algorithms that pertain to the evaluation of patients at risk for COVID-19 are in a state of rapid change based on information released by regulatory bodies including the CDC and federal and state organizations. These policies and algorithms were followed during the patient's care in the ED.    Patient is an 87 year old who comes in with right arm weakness and concern for changes in her right face.  CT scan is concerning for ischemia versus edema.  Will get MRI to evaluate for stroke, mass, edema.  Will get glucose to evaluate for hypoglycemia hyperglycemia and basic labs to evaluate for electrolyte abnormalities.  Patient denies any falls to suggest fractures  CT scan concerning for changes that can be seen with ischemia versus vasogenic edema.  Recommended MRI.  MRI concerning for masslike enhancement and edema in the left frontoparietal region enhancing mass as  well.  Consider possible glioblastoma  D.w Dr. Lacinda Axon from neurosurgery--> concern for GBM- Get CT CHEST ABD PELVIS.  CT chest abdomen pelvis was negative.  Discussed with Dr. Lacinda Axon and he can follow patient up in clinic tomorrow and potentially have biopsy done this week if family elects to proceed.  At this time we are holding off on steroids so that we can get an accurate biopsy.  She has not had any seizures so no evidence for Keppra.  Patient symptoms have been going on for greater then 10 days I feel comfortable with patient going home.  I  have updated both her and her sister about my concern for the possibility of brain cancer.  They expressed understanding and will follow-up with Dr. Adriana Simas tomorrow.  Their office will call her.  I discussed the provisional nature of ED diagnosis, the treatment so far, the ongoing plan of care, follow up appointments and return precautions with the patient and any family or support people present. They expressed understanding and agreed with the plan, discharged home.   _______________________________________   FINAL CLINICAL IMPRESSION(S) / ED DIAGNOSES   Final diagnoses:  Head mass  Left arm weakness      MEDICATIONS GIVEN DURING THIS VISIT:  Medications  gadobutrol (GADAVIST) 1 MMOL/ML injection 6 mL (6 mLs Intravenous Contrast Given 07/20/20 1859)  iohexol (OMNIPAQUE) 300 MG/ML solution 100 mL (100 mLs Intravenous Contrast Given 07/20/20 2005)     ED Discharge Orders    None       Note:  This document was prepared using Dragon voice recognition software and may include unintentional dictation errors.   Concha Se, MD 07/20/20 2041

## 2020-07-20 NOTE — ED Notes (Signed)
Pt returned from MRI at this time

## 2020-07-20 NOTE — ED Notes (Signed)
Pt assisted out of bed and ambulated to restroom with rolling walker.

## 2020-07-20 NOTE — Discharge Instructions (Addendum)
Your MRI is concerning for the possibility of a brain cancer.  I spoke with the neurosurgeon Dr. Adriana Simas who wants to see you in his office tomorrow at the [facility] clinic.  They will call you tomorrow with an appointment time.  I have also provided the information above in case you need to reach out to them.  You should follow-up in the clinic to discuss the next steps.  They may elect to do a biopsy if you want.  Your CT scan of your chest abdomen pelvis did not show any other signs of cancer If you develop worsening weakness, confusion, seizures or any other concerns you should return to the ER immediately     IMPRESSION:  1. Masslike enhancement and edema in the left frontoparietal region  and nonenhancing masslike T2 hyperintensity in the mesial left  temporal lobe. Differential considerations include primary CNS  neoplasm such as glioblastoma, lymphoma, infectious  encephalitis/cerebritis, and autoimmune encephalitis.  2. Mild chronic small vessel ischemic disease with chronic lacunar  infarcts.

## 2020-07-23 ENCOUNTER — Emergency Department: Payer: Medicare Other

## 2020-07-23 ENCOUNTER — Other Ambulatory Visit: Payer: Self-pay

## 2020-07-23 DIAGNOSIS — Z79899 Other long term (current) drug therapy: Secondary | ICD-10-CM | POA: Diagnosis not present

## 2020-07-23 DIAGNOSIS — G939 Disorder of brain, unspecified: Secondary | ICD-10-CM | POA: Insufficient documentation

## 2020-07-23 DIAGNOSIS — E1122 Type 2 diabetes mellitus with diabetic chronic kidney disease: Secondary | ICD-10-CM | POA: Insufficient documentation

## 2020-07-23 DIAGNOSIS — I129 Hypertensive chronic kidney disease with stage 1 through stage 4 chronic kidney disease, or unspecified chronic kidney disease: Secondary | ICD-10-CM | POA: Diagnosis not present

## 2020-07-23 DIAGNOSIS — N189 Chronic kidney disease, unspecified: Secondary | ICD-10-CM | POA: Diagnosis not present

## 2020-07-23 DIAGNOSIS — E1140 Type 2 diabetes mellitus with diabetic neuropathy, unspecified: Secondary | ICD-10-CM | POA: Insufficient documentation

## 2020-07-23 DIAGNOSIS — M62838 Other muscle spasm: Secondary | ICD-10-CM | POA: Insufficient documentation

## 2020-07-23 DIAGNOSIS — Z7982 Long term (current) use of aspirin: Secondary | ICD-10-CM | POA: Diagnosis not present

## 2020-07-23 DIAGNOSIS — Z7984 Long term (current) use of oral hypoglycemic drugs: Secondary | ICD-10-CM | POA: Diagnosis not present

## 2020-07-23 DIAGNOSIS — Z794 Long term (current) use of insulin: Secondary | ICD-10-CM | POA: Insufficient documentation

## 2020-07-23 DIAGNOSIS — M6281 Muscle weakness (generalized): Secondary | ICD-10-CM | POA: Diagnosis present

## 2020-07-23 DIAGNOSIS — R29818 Other symptoms and signs involving the nervous system: Secondary | ICD-10-CM | POA: Diagnosis not present

## 2020-07-23 NOTE — ED Triage Notes (Signed)
Pt has been dx with brain mass that has caused weakness to right side. States today she was not able to move right arm, and had right facial numbness. States symptoms worsened at 2200 tonight.

## 2020-07-23 NOTE — ED Notes (Signed)
Spoke with Dr. Roxan Hockey about case and ordered to do stroke protocols but not a code stroke a this time.

## 2020-07-23 NOTE — ED Triage Notes (Signed)
EMS brings pt in from home; st dx brain mass on Tuesday; cont to have "stiffness" to rt arm with pain; seen on 1/10 for same

## 2020-07-24 ENCOUNTER — Emergency Department
Admission: EM | Admit: 2020-07-24 | Discharge: 2020-07-24 | Disposition: A | Discharge status: Home / Self Care | Payer: Medicare Other | Attending: Emergency Medicine | Admitting: Emergency Medicine

## 2020-07-24 DIAGNOSIS — G939 Disorder of brain, unspecified: Secondary | ICD-10-CM | POA: Diagnosis not present

## 2020-07-24 DIAGNOSIS — M62838 Other muscle spasm: Secondary | ICD-10-CM

## 2020-07-24 LAB — URINALYSIS, COMPLETE (UACMP) WITH MICROSCOPIC
Bilirubin Urine: NEGATIVE
Glucose, UA: NEGATIVE mg/dL
Hgb urine dipstick: NEGATIVE
Ketones, ur: NEGATIVE mg/dL
Nitrite: POSITIVE — AB
Protein, ur: NEGATIVE mg/dL
Specific Gravity, Urine: 1.003 — ABNORMAL LOW (ref 1.005–1.030)
pH: 8 (ref 5.0–8.0)

## 2020-07-24 LAB — CBC
HCT: 32.3 % — ABNORMAL LOW (ref 36.0–46.0)
Hemoglobin: 10.8 g/dL — ABNORMAL LOW (ref 12.0–15.0)
MCH: 32.4 pg (ref 26.0–34.0)
MCHC: 33.4 g/dL (ref 30.0–36.0)
MCV: 97 fL (ref 80.0–100.0)
Platelets: 272 10*3/uL (ref 150–400)
RBC: 3.33 MIL/uL — ABNORMAL LOW (ref 3.87–5.11)
RDW: 15.4 % (ref 11.5–15.5)
WBC: 7.6 10*3/uL (ref 4.0–10.5)
nRBC: 0 % (ref 0.0–0.2)

## 2020-07-24 LAB — COMPREHENSIVE METABOLIC PANEL
ALT: 17 U/L (ref 0–44)
AST: 25 U/L (ref 15–41)
Albumin: 3.8 g/dL (ref 3.5–5.0)
Alkaline Phosphatase: 63 U/L (ref 38–126)
Anion gap: 11 (ref 5–15)
BUN: 22 mg/dL (ref 8–23)
CO2: 24 mmol/L (ref 22–32)
Calcium: 9.3 mg/dL (ref 8.9–10.3)
Chloride: 101 mmol/L (ref 98–111)
Creatinine, Ser: 1.29 mg/dL — ABNORMAL HIGH (ref 0.44–1.00)
GFR, Estimated: 40 mL/min — ABNORMAL LOW (ref >60)
Glucose, Bld: 159 mg/dL — ABNORMAL HIGH (ref 70–99)
Potassium: 3.9 mmol/L (ref 3.5–5.1)
Sodium: 136 mmol/L (ref 135–145)
Total Bilirubin: 0.6 mg/dL (ref 0.3–1.2)
Total Protein: 7.4 g/dL (ref 6.5–8.1)

## 2020-07-24 LAB — APTT: aPTT: 31 seconds (ref 24–36)

## 2020-07-24 LAB — TROPONIN I (HIGH SENSITIVITY)
Troponin I (High Sensitivity): 11 ng/L (ref <18)
Troponin I (High Sensitivity): 26 ng/L — ABNORMAL HIGH (ref <18)

## 2020-07-24 LAB — PROTIME-INR
INR: 1 (ref 0.8–1.2)
Prothrombin Time: 13.2 seconds (ref 11.4–15.2)

## 2020-07-24 MED ORDER — LEVETIRACETAM 500 MG PO TABS
500.0000 mg | ORAL_TABLET | Freq: Once | ORAL | Status: AC
  Administered 2020-07-24: 500 mg via ORAL
  Filled 2020-07-24: qty 1

## 2020-07-24 MED ORDER — LEVETIRACETAM 500 MG PO TABS: 500.0000 mg | ORAL_TABLET | Freq: Two times a day (BID) | ORAL | 1 refills | Status: AC

## 2020-07-24 NOTE — ED Notes (Signed)
This tech and Angela,RN assisted pt to toilet.

## 2020-07-24 NOTE — ED Provider Notes (Signed)
[facility] Emergency Department Provider Note   ____________________________________________   Event Date/Time   First MD Initiated Contact with Patient 07/24/20 0957     (approximate)  I have reviewed the triage vital signs and the nursing notes.   HISTORY  Chief Complaint Stroke Symptoms    HPI Megan Griffin is a 87 y.o. female with past medical history of hypertension, diabetes, CKD, and gout who presents to the ED complaining of arm weakness.  Patient states that she has weakness in her right arm chronically for at least the past 6 months.  She was evaluated for this in the ED 4 days ago, when she was diagnosed with left frontal brain lesion.  She has since followed up with neurosurgery and is scheduled for biopsy next week.  She denies any changes in the function of her right arm, but last night had an episode where the arm seem to tense up.  She describes it as like a muscle spasm that was painful and where her arm seem to be locked in place.  She had a similar sensation along the right side of her face where her eyes seem to tense up and was difficult for her to move.  This lasted for about 30 minutes before resolving.  She has had no further episodes since then.  She denies any symptoms in either leg and has not had any issues with her left arm.  She denies any history of seizures and does not currently take any anticonvulsant medication.        Past Medical History:  Diagnosis Date  . Arthritis   . Cataracts, bilateral   . Chickenpox   . Chronic kidney disease   . Diabetes mellitus without complication (HCC)   . GERD (gastroesophageal reflux disease)   . Gout   . H/O colonoscopy   . Hemorrhoids   . Hypertension   . Hypomagnesemia   . Lumbar spinal stenosis   . Osteoporosis   . Peripheral neuropathy   . Vitamin D deficiency     There are no problems to display for this patient.   Past Surgical History:  Procedure Laterality Date   . CHOLECYSTECTOMY    . ESOPHAGOGASTRODUODENOSCOPY (EGD) WITH PROPOFOL N/A 03/24/2015   Procedure: ESOPHAGOGASTRODUODENOSCOPY (EGD) WITH PROPOFOL;  Surgeon: Christena Deem, MD;  Location: [facility] ENDOSCOPY;  Service: Endoscopy;  Laterality: N/A;  . EYE SURGERY    . KIDNEY STONE SURGERY    . RETINAL DETACHMENT SURGERY      Prior to Admission medications   Medication Sig Start Date End Date Taking? Authorizing Provider  levETIRAcetam (KEPPRA) 500 MG tablet Take 1 tablet (500 mg total) by mouth 2 (two) times daily. 07/24/20 09/22/20 Yes Chesley Noon, MD  acetaminophen (TYLENOL) 500 MG tablet Take 500 mg by mouth every 6 (six) hours as needed.    [provider]  allopurinol (ZYLOPRIM) 100 MG tablet Take 200 mg by mouth daily.    [provider]  amLODipine (NORVASC) 5 MG tablet Take 5 mg by mouth daily.    [provider]  aspirin 81 MG tablet Take 81 mg by mouth daily.    [provider]  calcium-vitamin D (OSCAL WITH D) 500-200 MG-UNIT per tablet Take 1 tablet by mouth.    [provider]  Cyanocobalamin (VITAMIN B 12) 250 MCG LOZG Take by mouth.    [provider]  Fish Oil-Cholecalciferol (FISH OIL + D3) 1000-1000 MG-UNIT CAPS Take 1,000 mg by mouth  every morning.    [provider]  furosemide (LASIX) 40 MG tablet Take 40 mg by mouth daily.    [provider]  glipiZIDE (GLUCOTROL XL) 2.5 MG 24 hr tablet Take 2.5 mg by mouth daily with breakfast.    [provider]  glucose blood test strip 1 each by Other route as needed for other. Use as instructed    [provider]  lisinopril (PRINIVIL,ZESTRIL) 40 MG tablet Take 40 mg by mouth daily.    [provider]  magnesium oxide (MAG-OX) 400 MG tablet Take 400 mg by mouth daily.    [provider]  Multiple Vitamin (MULTIVITAMIN) tablet Take 1 tablet by mouth daily.    [provider]  omeprazole (PRILOSEC) 20 MG capsule Take 20  mg by mouth daily.    [provider]  vitamin C (ASCORBIC ACID) 500 MG tablet Take 500 mg by mouth daily.    [provider]    Allergies Actonel [risedronate sodium], Bextra [valdecoxib], Lipitor [atorvastatin], Lovastatin, Sular [nisoldipine er], and Zocor [simvastatin]  Family History  Problem Relation Age of Onset  . Breast cancer Paternal Aunt     Social History Social History   Tobacco Use  . Smoking status: Never Smoker  . Smokeless tobacco: Never Used  Substance Use Topics  . Alcohol use: No  . Drug use: No    Review of Systems  Constitutional: No fever/chills Eyes: No visual changes. ENT: No sore throat. Cardiovascular: Denies chest pain. Respiratory: Denies shortness of breath. Gastrointestinal: No abdominal pain.  No nausea, no vomiting.  No diarrhea.  No constipation. Genitourinary: Negative for dysuria. Musculoskeletal: Negative for back pain. Skin: Negative for rash. Neurological: Negative for headaches, focal weakness or numbness.  Positive for right arm pain and stiffness.  ____________________________________________   PHYSICAL EXAM:  VITAL SIGNS: ED Triage Vitals  Enc Vitals Group     BP 07/23/20 2327 (!) 145/75     Pulse Rate 07/23/20 2327 83     Resp 07/23/20 2327 18     Temp 07/23/20 2327 98.3 F (36.8 C)     Temp Source 07/23/20 2327 Oral     SpO2 07/23/20 2307 100 %     Weight 07/23/20 2329 152 lb (68.9 kg)     Height 07/23/20 2329 4\' 11"  (1.499 m)     Head Circumference --      Peak Flow --      Pain Score 07/23/20 2328 0     Pain Loc --      Pain Edu? --      Excl. in GC? --     Constitutional: Alert and oriented. Eyes: Conjunctivae are normal. Head: Atraumatic. Nose: No congestion/rhinnorhea. Mouth/Throat: Mucous membranes are moist. Neck: Normal ROM Cardiovascular: Normal rate, regular rhythm. Grossly normal heart sounds. Respiratory: Normal respiratory effort.  No retractions. Lungs  CTAB. Gastrointestinal: Soft and nontender. No distention. Genitourinary: deferred Musculoskeletal: No lower extremity tenderness nor edema. Neurologic:  Normal speech and language.  4 out of 5 strength in right upper extremity, 5 out of 5 strength in left upper extremity and bilateral lower extremities. Skin:  Skin is warm, dry and intact. No rash noted. Psychiatric: Mood and affect are normal. Speech and behavior are normal.  ____________________________________________   LABS (all labs ordered are listed, but only abnormal results are displayed)  Labs Reviewed  CBC - Abnormal; Notable for the following components:      Result Value   RBC 3.33 (*)  Hemoglobin 10.8 (*)    HCT 32.3 (*)    All other components within normal limits  COMPREHENSIVE METABOLIC PANEL - Abnormal; Notable for the following components:   Glucose, Bld 159 (*)    Creatinine, Ser 1.29 (*)    GFR, Estimated 40 (*)    All other components within normal limits  URINALYSIS, COMPLETE (UACMP) WITH MICROSCOPIC - Abnormal; Notable for the following components:   Color, Urine STRAW (*)    APPearance CLEAR (*)    Specific Gravity, Urine 1.003 (*)    Nitrite POSITIVE (*)    Leukocytes,Ua SMALL (*)    Bacteria, UA RARE (*)    All other components within normal limits  TROPONIN I (HIGH SENSITIVITY) - Abnormal; Notable for the following components:   Troponin I (High Sensitivity) 26 (*)    All other components within normal limits  URINE CULTURE  PROTIME-INR  APTT  TROPONIN I (HIGH SENSITIVITY)   ____________________________________________  EKG  ED ECG REPORT I, Chesley Noon, the attending physician, personally viewed and interpreted this ECG.   Date: 07/24/2020  EKG Time: 23:38  Rate: 76  Rhythm: normal sinus rhythm  Axis: Normal  Intervals:first-degree A-V block   ST&T Change: None   PROCEDURES  Procedure(s) performed (including Critical  Care):  Procedures   ____________________________________________   INITIAL IMPRESSION / ASSESSMENT AND PLAN / ED COURSE       87 year old female with past medical history of hypertension, diabetes, CKD, and gout who presents to the ED for episode of stiffness and difficulty moving her right arm and right face that lasted for about 30 minutes.  All symptoms have now resolved and patient is back to her baseline with unchanged weakness in her right upper extremity.  Episode counts concerning for muscle spasm but would also consider partial seizure given it affected both her arm and face.  CT head is unchanged from previous, redemonstrates left frontal lobe lesion.  Lab work is unremarkable, EKG shows no evidence of arrhythmia or ischemia.  Case discussed with Dr. Marcell Barlow of neurosurgery, who agrees with plan to start patient on 500 mg Keppra twice daily.  Patient also requesting her preoperative coags and UA be performed.  UA shows questionable infection, but given patient denies any UTI symptoms, we will hold off on antibiotics and send for culture.  Patient counseled to follow-up with neurosurgery and otherwise return to the ED for new worsening symptoms, patient agrees with plan.      ____________________________________________   FINAL CLINICAL IMPRESSION(S) / ED DIAGNOSES  Final diagnoses:  Brain lesion  Muscle spasm     ED Discharge Orders         Ordered    levETIRAcetam (KEPPRA) 500 MG tablet  2 times daily        07/24/20 1434           Note:  This document was prepared using Dragon voice recognition software and may include unintentional dictation errors.   Chesley Noon, MD 07/24/20 [phone]

## 2020-07-26 LAB — URINE CULTURE

## 2020-10-09 DEATH — deceased

## 2021-01-08 ENCOUNTER — Encounter (INDEPENDENT_AMBULATORY_CARE_PROVIDER_SITE_OTHER): Payer: Medicare Other | Admitting: Ophthalmology

## 2021-04-15 IMAGING — MR MR HEAD WO/W CM
14 series · 45 of 48 positions shown · IV contrast (gadavist)
Comparison: Head CT 07/20/2020

CLINICAL DATA: Right upper extremity weakness.

EXAM:
MRI HEAD WITHOUT AND WITH CONTRAST
TECHNIQUE: Multiplanar, multiecho pulse sequences of the brain and surrounding
structures were obtained without and with intravenous contrast.
CONTRAST:  6mL GADAVIST GADOBUTROL 1 MMOL/ML IV SOLN

[Series 5: ax dwi_tracew · axial · 3.0mm · 0.60mm/px · z∈[-144,+10]mm · 4 of 48 slices shown]
[im 1/48]
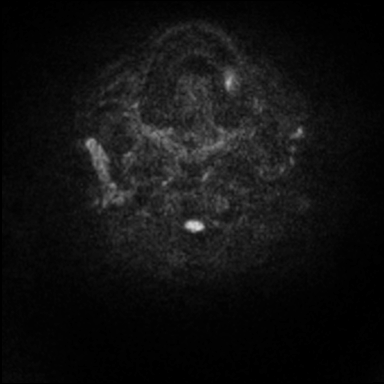
[im 16/48]
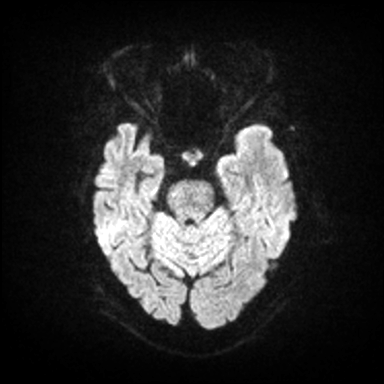
[im 32/48]
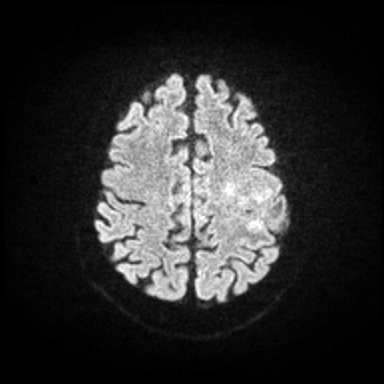
[im 48/48]
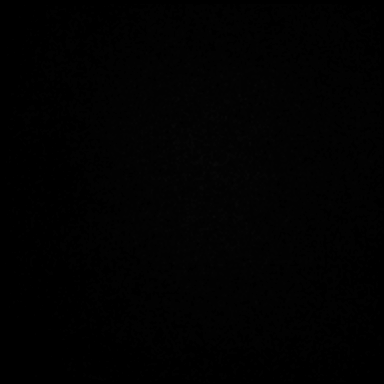

[Series 6: ax dwi_adc · axial · 3.0mm · 0.60mm/px · z∈[-144,-3]mm · 3 of 44 slices shown]
[im 1/44]
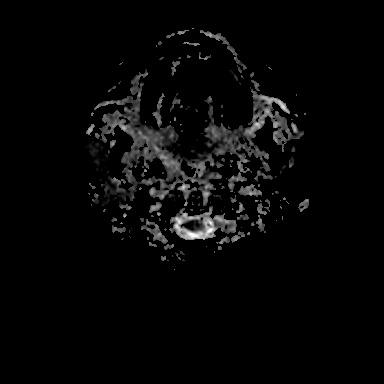
[im 22/44]
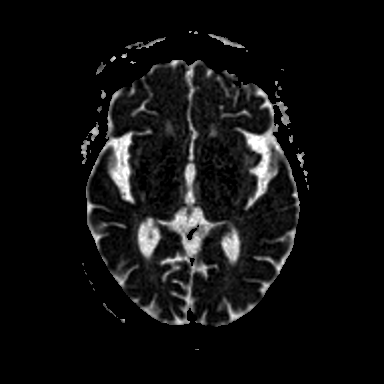
[im 44/44]
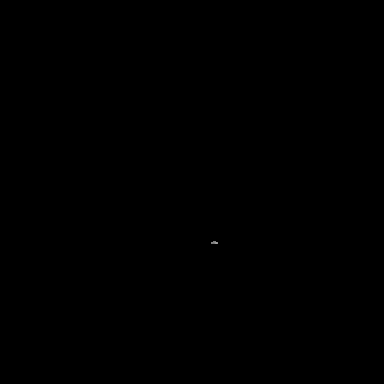

[Series 7: cor dwi_tracew · coronal · 5.0mm · 0.60mm/px · 2 of 40 slices shown]
[im 1/40]
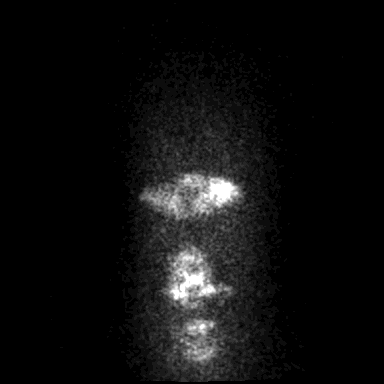
[im 40/40]
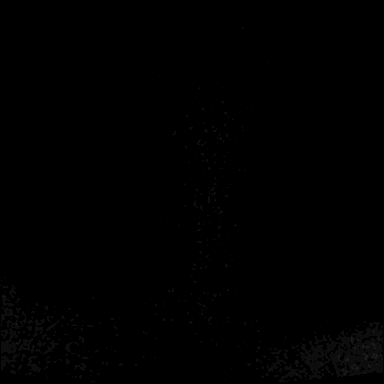

[Series 8: cor dwi_adc · coronal · 5.0mm · 0.60mm/px · 2 of 34 slices shown]
[im 1/34]
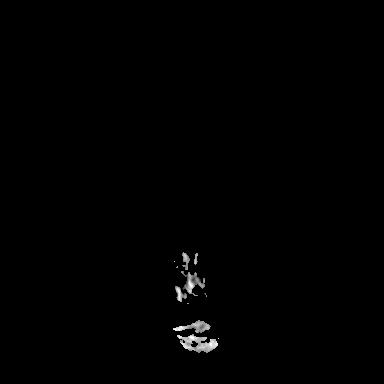
[im 34/34]
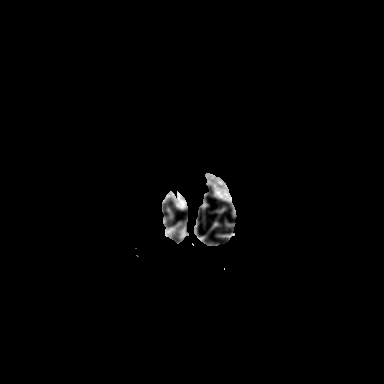

[Series 9: T1 · sagittal · 5.0mm · 0.62mm/px · 1 of 25 slices shown (1 of 2)]
[im 1/25]
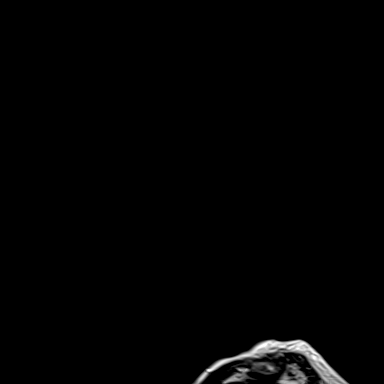

[Series 10: T2 · axial · 5.0mm · 0.53mm/px · 1 of 25 slices shown]
[im 1/25]
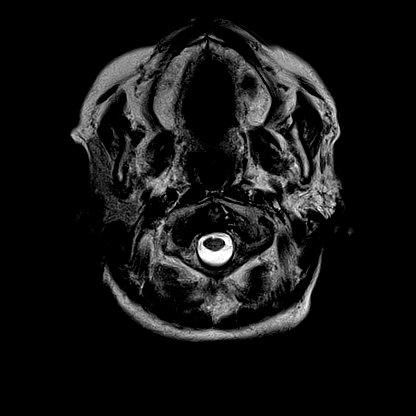

[Series 11: FLAIR · axial · 3.0mm · 0.53mm/px · z∈[-146,+14]mm · 3 of 55 slices shown]
[im 1/55]
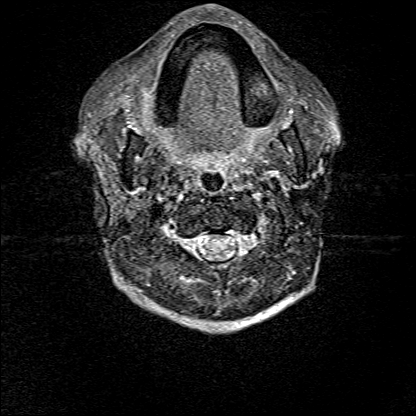
[im 28/55]
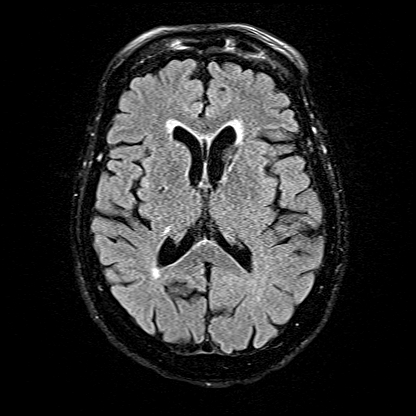
[im 55/55]
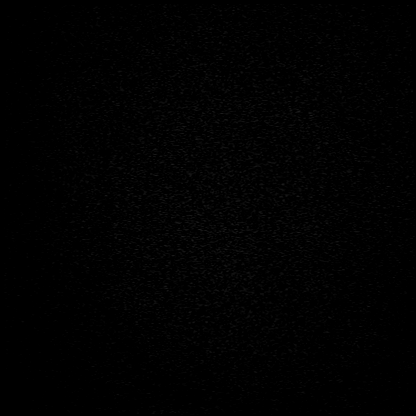

[Series 12: T1 · axial · 1.0mm · 0.98mm/px · z∈[-154,+18]mm · 8 of 175 slices shown (2 of 2)]
[im 1/175]
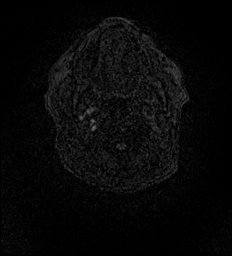
[im 20/175]
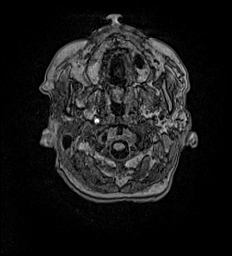
[im 59/175]
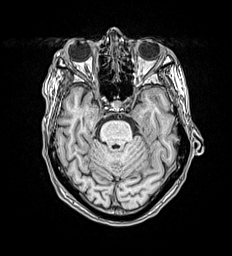
[im 78/175]
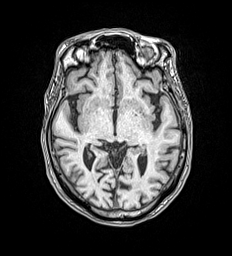
[im 97/175]
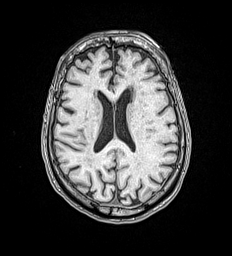
[im 117/175]
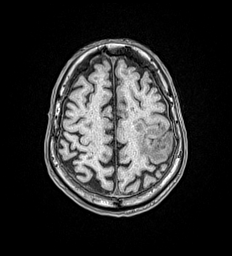
[im 155/175]
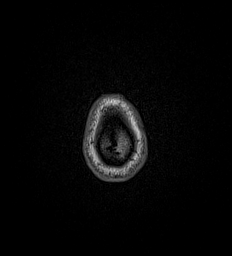
[im 175/175]
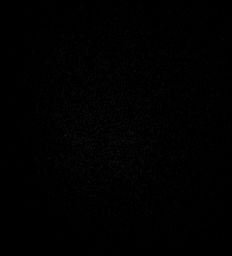

[Series 14: pha_images · axial · 3.0mm · 0.90mm/px · z∈[-150,+10]mm · 3 of 54 slices shown]
[im 1/54]
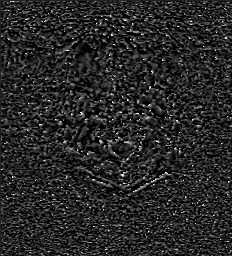
[im 27/54]
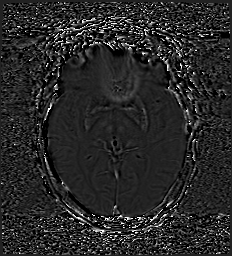
[im 54/54]
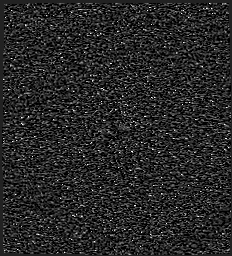

[Series 15: swi_images · axial · 3.0mm · 0.90mm/px · z∈[-153,+22]mm · 4 of 60 slices shown]
[im 1/60]
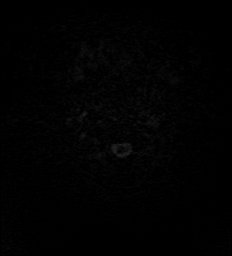
[im 20/60]
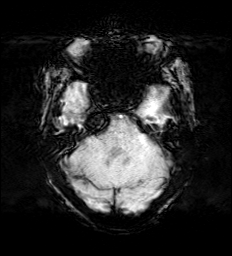
[im 40/60]
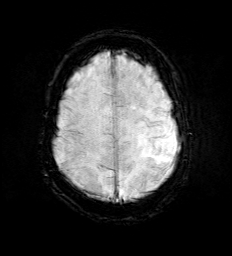
[im 60/60]
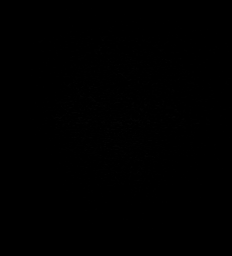

[Series 17: T2 post-contrast · coronal · 5.0mm · 0.57mm/px · 2 of 29 slices shown]
[im 1/29]
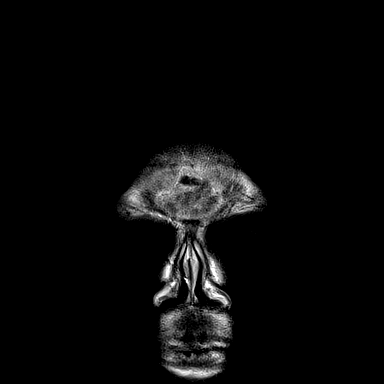
[im 29/29]
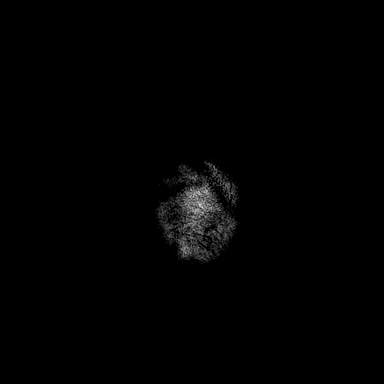

[Series 18: T1 post-contrast · axial · 1.0mm · 0.98mm/px · z∈[-154,+19]mm · 9 of 176 slices shown (1 of 3)]
[im 1/176]
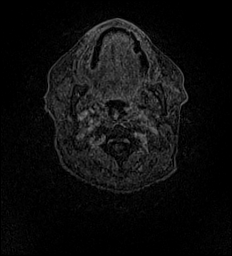
[im 20/176]
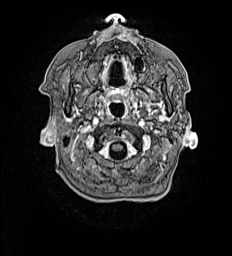
[im 39/176]
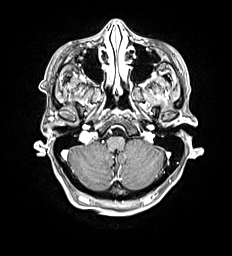
[im 59/176]
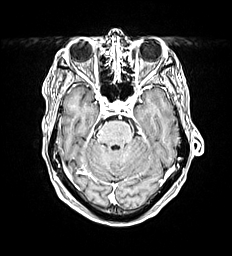
[im 78/176]
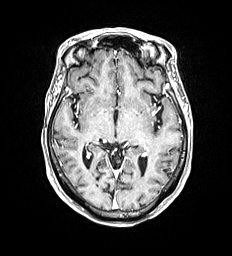
[im 98/176]
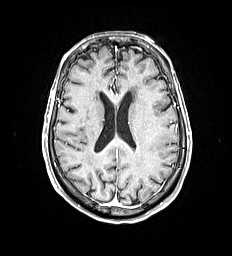
[im 117/176]
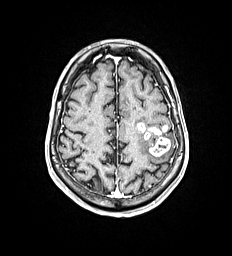
[im 156/176]
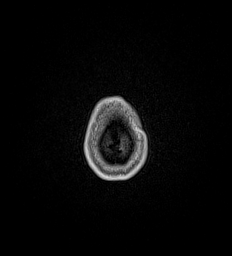
[im 176/176]
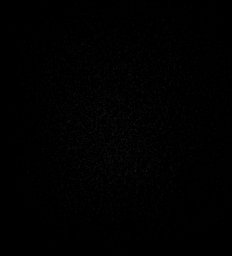

[Series 19: T1 post-contrast · coronal · 5.0mm · 0.57mm/px · 2 of 29 slices shown (2 of 3)]
[im 1/29]
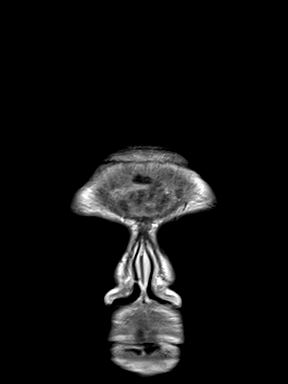
[im 29/29]
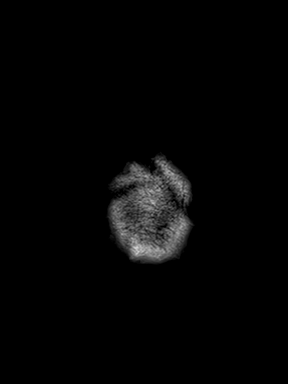

[Series 20: T1 post-contrast · sagittal · 5.0mm · 0.62mm/px · 1 of 25 slices shown (3 of 3)]
[im 1/25]
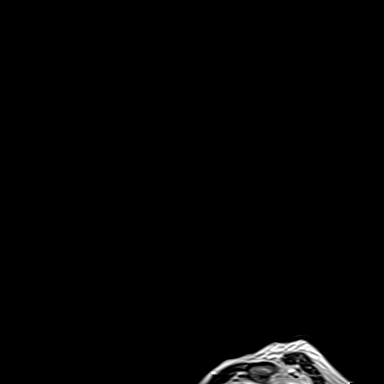

[45 of 48 positions shown; findings below may reference images not displayed]

FINDINGS: Brain: There are clustered foci of heterogeneous, masslike
enhancement in the left parietal and posterior frontal lobes
extending over a 4.0 x 3.2 cm region with mild-to-moderate T2
hyperintensity/edema in the surrounding white matter. This is
primarily a white matter process with only mild involvement of the
cortex. There is abnormal T2 hyperintensity in the mesial left
temporal lobe with enlargement of the hippocampus without associated
enhancement.

No intracranial hemorrhage, midline shift, or extra-axial fluid
collection is identified. There are chronic lacunar infarcts in the
basal ganglia bilaterally and in the right thalamus, and there is
mild ex vacuo dilatation of the frontal horn of the left lateral
ventricle. Small T2 hyperintensities in the cerebral white matter
bilaterally are nonspecific but compatible with mild chronic small
vessel ischemic disease. Mild cerebral atrophy is within normal
limits for age.

Vascular: Major intracranial vascular flow voids are preserved.

Skull and upper cervical spine: Unremarkable bone marrow signal.

Sinuses/Orbits: Bilateral cataract extraction. Paranasal sinuses and
mastoid air cells are clear.

Other: None.
IMPRESSION: 1. Masslike enhancement and edema in the left frontoparietal region
and nonenhancing masslike T2 hyperintensity in the mesial left
temporal lobe. Differential considerations include primary CNS
neoplasm such as glioblastoma, lymphoma, infectious
encephalitis/cerebritis, and autoimmune encephalitis.
2. Mild chronic small vessel ischemic disease with chronic lacunar
infarcts.

These results were called by telephone at the time of interpretation
on 07/20/2020 at [DATE] to Dr. Nejada Prebibaj, who verbally acknowledged
these results.
